# Patient Record
Sex: Female | Born: 1975 | Race: Black or African American | Hispanic: No | Marital: Married | Smoking: Never smoker
Health system: Southern US, Community
[De-identification: ages and names within clinical notes are randomized; demographics above are authoritative.]

## PROBLEM LIST (undated history)

## (undated) ENCOUNTER — Inpatient Hospital Stay (HOSPITAL_COMMUNITY): Payer: Self-pay

## (undated) DIAGNOSIS — D219 Benign neoplasm of connective and other soft tissue, unspecified: Secondary | ICD-10-CM

## (undated) DIAGNOSIS — E669 Obesity, unspecified: Secondary | ICD-10-CM

## (undated) DIAGNOSIS — O021 Missed abortion: Secondary | ICD-10-CM

## (undated) DIAGNOSIS — Z973 Presence of spectacles and contact lenses: Secondary | ICD-10-CM

---

## 1999-07-09 ENCOUNTER — Other Ambulatory Visit: Admission: RE | Admit: 1999-07-09 | Discharge: 1999-07-09 | Payer: Self-pay | Admitting: Gynecology

## 2000-07-02 ENCOUNTER — Other Ambulatory Visit: Admission: RE | Admit: 2000-07-02 | Discharge: 2000-07-02 | Payer: Self-pay | Admitting: Obstetrics and Gynecology

## 2000-07-19 ENCOUNTER — Emergency Department (HOSPITAL_COMMUNITY): Admission: EM | Admit: 2000-07-19 | Discharge: 2000-07-19 | Payer: Self-pay | Admitting: Emergency Medicine

## 2002-01-11 ENCOUNTER — Encounter: Payer: Self-pay | Admitting: Emergency Medicine

## 2002-01-11 ENCOUNTER — Emergency Department (HOSPITAL_COMMUNITY): Admission: EM | Admit: 2002-01-11 | Discharge: 2002-01-11 | Payer: Self-pay | Admitting: Emergency Medicine

## 2002-01-20 ENCOUNTER — Emergency Department (HOSPITAL_COMMUNITY): Admission: EM | Admit: 2002-01-20 | Discharge: 2002-01-20 | Payer: Self-pay | Admitting: Emergency Medicine

## 2002-02-01 ENCOUNTER — Ambulatory Visit (HOSPITAL_COMMUNITY): Admission: RE | Admit: 2002-02-01 | Discharge: 2002-02-01 | Payer: Self-pay | Admitting: Internal Medicine

## 2002-04-14 ENCOUNTER — Other Ambulatory Visit: Admission: RE | Admit: 2002-04-14 | Discharge: 2002-04-14 | Payer: Self-pay | Admitting: Gynecology

## 2003-09-02 ENCOUNTER — Emergency Department (HOSPITAL_COMMUNITY): Admission: EM | Admit: 2003-09-02 | Discharge: 2003-09-02 | Payer: Self-pay | Admitting: Emergency Medicine

## 2003-09-04 ENCOUNTER — Emergency Department (HOSPITAL_COMMUNITY): Admission: EM | Admit: 2003-09-04 | Discharge: 2003-09-05 | Payer: Self-pay | Admitting: Emergency Medicine

## 2006-02-05 ENCOUNTER — Emergency Department (HOSPITAL_COMMUNITY): Admission: EM | Admit: 2006-02-05 | Discharge: 2006-02-05 | Payer: Self-pay | Admitting: Emergency Medicine

## 2006-02-06 ENCOUNTER — Emergency Department (HOSPITAL_COMMUNITY): Admission: EM | Admit: 2006-02-06 | Discharge: 2006-02-06 | Payer: Self-pay | Admitting: Emergency Medicine

## 2006-07-15 ENCOUNTER — Other Ambulatory Visit: Admission: RE | Admit: 2006-07-15 | Discharge: 2006-07-15 | Payer: Self-pay | Admitting: Gynecology

## 2010-05-21 ENCOUNTER — Emergency Department (HOSPITAL_COMMUNITY): Admission: EM | Admit: 2010-05-21 | Discharge: 2010-05-21 | Payer: Self-pay | Admitting: Emergency Medicine

## 2011-04-28 ENCOUNTER — Emergency Department (HOSPITAL_COMMUNITY)
Admission: EM | Admit: 2011-04-28 | Discharge: 2011-04-29 | Disposition: A | Discharge status: Home / Self Care | Payer: 59 | Attending: Emergency Medicine | Admitting: Emergency Medicine

## 2011-04-28 DIAGNOSIS — R21 Rash and other nonspecific skin eruption: Secondary | ICD-10-CM | POA: Insufficient documentation

## 2014-02-05 ENCOUNTER — Encounter (HOSPITAL_COMMUNITY): Payer: Self-pay | Admitting: Emergency Medicine

## 2014-02-05 ENCOUNTER — Emergency Department (HOSPITAL_COMMUNITY)
Admission: EM | Admit: 2014-02-05 | Discharge: 2014-02-05 | Disposition: A | Discharge status: Home / Self Care | Payer: BC Managed Care – PPO | Attending: Emergency Medicine | Admitting: Emergency Medicine

## 2014-02-05 DIAGNOSIS — Z79899 Other long term (current) drug therapy: Secondary | ICD-10-CM | POA: Insufficient documentation

## 2014-02-05 DIAGNOSIS — B86 Scabies: Secondary | ICD-10-CM | POA: Insufficient documentation

## 2014-02-05 MED ORDER — PREDNISONE 20 MG PO TABS: ORAL_TABLET | ORAL | Status: DC

## 2014-02-05 MED ORDER — PERMETHRIN 5 % EX CREA: TOPICAL_CREAM | CUTANEOUS | Status: DC

## 2014-02-05 MED ORDER — HYDROXYZINE HCL 25 MG PO TABS: 25.0000 mg | ORAL_TABLET | Freq: Four times a day (QID) | ORAL | Status: DC

## 2014-02-05 NOTE — Discharge Instructions (Signed)
Scabies  Scabies are small bugs (mites) that burrow under the skin and cause red bumps and severe itching. These bugs can only be seen with a microscope. Scabies are highly contagious. They can spread easily from person to person by direct contact. They are also spread through sharing clothing or linens that have the scabies mites living in them. It is not unusual for an entire family to become infected through shared towels, clothing, or bedding.   HOME CARE INSTRUCTIONS   · Your caregiver may prescribe a cream or lotion to kill the mites. If cream is prescribed, massage the cream into the entire body from the neck to the bottom of both feet. Also massage the cream into the scalp and face if your child is less than 1 year old. Avoid the eyes and mouth. Do not wash your hands after application.  · Leave the cream on for 8 to 12 hours. Your child should bathe or shower after the 8 to 12 hour application period. Sometimes it is helpful to apply the cream to your child right before bedtime.  · One treatment is usually effective and will eliminate approximately 95% of infestations. For severe cases, your caregiver may decide to repeat the treatment in 1 week. Everyone in your household should be treated with one application of the cream.  · New rashes or burrows should not appear within 24 to 48 hours after successful treatment. However, the itching and rash may last for 2 to 4 weeks after successful treatment. Your caregiver may prescribe a medicine to help with the itching or to help the rash go away more quickly.  · Scabies can live on clothing or linens for up to 3 days. All of your child's recently used clothing, towels, stuffed toys, and bed linens should be washed in hot water and then dried in a dryer for at least 20 minutes on high heat. Items that cannot be washed should be enclosed in a plastic bag for at least 3 days.  · To help relieve itching, bathe your child in a cool bath or apply cool washcloths to the  affected areas.  · Your child may return to school after treatment with the prescribed cream.  SEEK MEDICAL CARE IF:   · The itching persists longer than 4 weeks after treatment.  · The rash spreads or becomes infected. Signs of infection include red blisters or yellow-tan crust.  Document Released: 09/15/2005 Document Revised: 12/08/2011 Document Reviewed: 01/24/2009  Exit[employer]® Patient Information ©2014 [employer].

## 2014-02-05 NOTE — ED Provider Notes (Signed)
CSN: [id number]     Arrival date & time 02/05/14  0913 History   First MD Initiated Contact with Patient 02/05/14 [phone]     Chief Complaint  Patient presents with  . Rash     (Consider location/radiation/quality/duration/timing/severity/associated sxs/prior Treatment) HPI Comments: Patient presents with a rash. It's been going on for about 4-5 days. She says is intensely itchy. She's been using hydrocortisone cream without relief. The rash is mostly around her genital area and she also has some bumps around her abdomen in both forearms. She denies any fevers vomiting or other recent illnesses. She's had 2 other family members with similar symptoms and was told he had bed bugs.  Patient is a 38 y.o. female presenting with rash.  Rash Associated symptoms: no abdominal pain, no diarrhea, no fatigue, no fever, no headaches, no joint pain, no nausea, no shortness of breath and not vomiting     History reviewed. No pertinent past medical history. History reviewed. No pertinent past surgical history. History reviewed. No pertinent family history. History  Substance Use Topics  . Smoking status: Never Smoker   . Smokeless tobacco: Not on file  . Alcohol Use: Yes     Comment: social   OB History   Grav Para Term Preterm Abortions TAB SAB Ect Mult Living                 Review of Systems  Constitutional: Negative for fever, chills, diaphoresis and fatigue.  HENT: Negative for congestion, rhinorrhea and sneezing.   Eyes: Negative.   Respiratory: Negative for cough, chest tightness and shortness of breath.   Cardiovascular: Negative for chest pain and leg swelling.  Gastrointestinal: Negative for nausea, vomiting, abdominal pain, diarrhea and blood in stool.  Genitourinary: Negative for frequency, hematuria, flank pain and difficulty urinating.  Musculoskeletal: Negative for arthralgias and back pain.  Skin: Positive for rash.  Neurological: Negative for dizziness, speech difficulty,  weakness, numbness and headaches.      Allergies  Review of patient's allergies indicates no known allergies.  Home Medications   Prior to Admission medications   Medication Sig Start Date End Date Taking? Authorizing Provider  Chaste Tree (VITEX EXTRACT PO) Take 1 capsule by mouth daily.   Yes Historical Provider, MD  hydrOXYzine (ATARAX/VISTARIL) 25 MG tablet Take 1 tablet (25 mg total) by mouth every 6 (six) hours. 02/05/14   Malvin Johns, MD  permethrin (ELIMITE) 5 % cream Apply to affected area once 02/05/14   Malvin Johns, MD  predniSONE (DELTASONE) 20 MG tablet 3 tabs po day one, then 2 po daily x 4 days 02/05/14   Malvin Johns, MD   BP 150/92  Pulse 90  Temp(Src) 98.4 F (36.9 C) (Oral)  Resp 16  SpO2 100%  LMP 01/29/2014 Physical Exam  Constitutional: She is oriented to person, place, and time. She appears well-developed and well-nourished.  HENT:  Head: Normocephalic and atraumatic.  Eyes: Pupils are equal, round, and reactive to light.  Neck: Normal range of motion. Neck supple.  Cardiovascular: Normal rate, regular rhythm and normal heart sounds.   Pulmonary/Chest: Effort normal and breath sounds normal. No respiratory distress. She has no wheezes. She has no rales. She exhibits no tenderness.  Abdominal: Soft. Bowel sounds are normal. There is no tenderness. There is no rebound and no guarding.  Musculoskeletal: Normal range of motion. She exhibits no edema.  Lymphadenopathy:    She has no cervical adenopathy.  Neurological: She is alert and oriented to person, place,  and time.  Skin: Skin is warm and dry. Rash noted.  Are unremarkable small raised papules around the genital area. There's no signs of surrounding infection. There's also some papules on the forearms and across the abdomen. There is no vesicles. No petechiae or purpura.  Psychiatric: She has a normal mood and affect.    ED Course  Procedures (including critical care time) Labs Review Labs  Reviewed - No data to display  Imaging Review No results found.   EKG Interpretation None      MDM   Final diagnoses:  Scabies    Patient appears to have a mite infestation. We'll go ahead and treat her with Elimite cream. I encouraged her to wash all her linens and clothes in hot water with a hot dryer. She was also given a prescription for prednisone and Atarax to help with the symptoms.    Malvin Johns, MD 02/05/14 1014

## 2014-02-05 NOTE — ED Notes (Signed)
Pt states rash to buttocks, stomach and rt arm.  Pt states comes and goes.  Yet, also states that bed partner has same bumps.

## 2014-10-06 ENCOUNTER — Encounter (HOSPITAL_COMMUNITY): Payer: Self-pay | Admitting: *Deleted

## 2014-10-06 ENCOUNTER — Inpatient Hospital Stay (HOSPITAL_COMMUNITY)
Admission: AD | Admit: 2014-10-06 | Discharge: 2014-10-06 | Disposition: A | Discharge status: Home / Self Care | Payer: BLUE CROSS/BLUE SHIELD | Source: Ambulatory Visit | Attending: Obstetrics & Gynecology | Admitting: Obstetrics & Gynecology

## 2014-10-06 DIAGNOSIS — R109 Unspecified abdominal pain: Secondary | ICD-10-CM | POA: Diagnosis present

## 2014-10-06 DIAGNOSIS — Z3201 Encounter for pregnancy test, result positive: Secondary | ICD-10-CM | POA: Diagnosis not present

## 2014-10-06 DIAGNOSIS — N979 Female infertility, unspecified: Secondary | ICD-10-CM | POA: Insufficient documentation

## 2014-10-06 DIAGNOSIS — Z3490 Encounter for supervision of normal pregnancy, unspecified, unspecified trimester: Secondary | ICD-10-CM

## 2014-10-06 HISTORY — DX: Obesity, unspecified: E66.9

## 2014-10-06 HISTORY — DX: Benign neoplasm of connective and other soft tissue, unspecified: D21.9

## 2014-10-06 LAB — URINALYSIS, ROUTINE W REFLEX MICROSCOPIC
Bilirubin Urine: NEGATIVE
Glucose, UA: NEGATIVE mg/dL
Hgb urine dipstick: NEGATIVE
Ketones, ur: 15 mg/dL — AB
Leukocytes, UA: NEGATIVE
Nitrite: NEGATIVE
Protein, ur: NEGATIVE mg/dL
Specific Gravity, Urine: 1.025 (ref 1.005–1.030)
Urobilinogen, UA: 0.2 mg/dL (ref 0.0–1.0)
pH: 6 (ref 5.0–8.0)

## 2014-10-06 LAB — POCT PREGNANCY, URINE: Preg Test, Ur: POSITIVE — AB

## 2014-10-06 LAB — HCG, QUANTITATIVE, PREGNANCY: hCG, Beta Chain, Quant, S: 127 m[IU]/mL — ABNORMAL HIGH (ref <5)

## 2014-10-06 MED ORDER — ULTIMATECARE ONE 27-1 MG PO CAPS: 1.0000 | ORAL_CAPSULE | Freq: Every day | ORAL | Status: DC

## 2014-10-06 NOTE — MAU Note (Signed)
Pt presents to MAU with complaints of lower abdominal pain for approximately 2 weeks. Denies any vaginal bleeding at this time. 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

## 2014-10-06 NOTE — MAU Provider Note (Signed)
  History     CSN: [id number]  Arrival date and time: 10/06/14 1427 Dr Benjie Karvonen placed initial orders Provider here to evaluate patient @ 1615   Chief Complaint  Patient presents with  . Abdominal Pain   HPI (+) home pregnancy test infertility x 12 years Cramping and abdominal aching No sharp abdominal pain No bleeding or spotting  Past Medical History  Diagnosis Date  . Fibroids    History reviewed. No pertinent past surgical history.  History reviewed. No pertinent family history.  History  Substance Use Topics  . Smoking status: Never Smoker   . Smokeless tobacco: Never Used  . Alcohol Use: Yes     Comment: social   Allergies: No Known Allergies  Current medications  ROS  Cramping No bleeding or spotting  Physical Exam   Blood pressure 167/97, temperature 99 F (37.2 C), resp. rate 18, height 5\' 1"  (1.549 m), weight 131.543 kg (290 lb), last menstrual period 08/18/2014.  Physical Exam  Urinalysis : 1025 spec gravity / 15 ketones / other- negative (+) SPT  Alert and oriented / NAD or pain Abdomen soft and non-tender / pendulous panus Spec Exam : scant white discharge / cervix closed without any bleeding Bimanual: uterus enlarged with nodular lesion with 3-4cm in lower uterine segment of uterus                   non-tender with exam                   adnexa without tenderness / limitation due to habitus to identify ovary  MAU Course  Procedures  Quant HCG - 127   Assessment and Plan  (+) pregnancy confirmed - 7 weeks by LMP not correlated by HCG level  Repeat HCG Monday at [facility] Call if any red bleeding or spotting over weekend / any sharp unilateral and persistent pain                (miscarriage and ectopic precautions)  Artelia Laroche 10/06/2014, 5:23 PM

## 2014-11-29 ENCOUNTER — Encounter (HOSPITAL_BASED_OUTPATIENT_CLINIC_OR_DEPARTMENT_OTHER): Payer: Self-pay | Admitting: *Deleted

## 2014-11-29 NOTE — H&P (Signed)
Vicki Thompson is an 39 y.o. female with missed abortion at 55 weeks by early ultrasound.  Patient was counseled re: her options and elected to proceed with D&C.  Medical hx complicated by AMA, fibroid uterus and obesity.  Pertinent Gynecological History: Menses: n/a Bleeding: light spotting Contraception: pregnancy DES exposure: unknown Blood transfusions: none Sexually transmitted diseases: no past history Previous GYN Procedures: none  Last mammogram: n/a Date: n/a Last pap: unkn Date: n/a OB History: G1   Menstrual History: Menarche age: n/a Patient's last menstrual period was 08/18/2014.    Past Medical History  Diagnosis Date  . Fibroids   . Missed ab   . Wears contact lenses     No past surgical history on file.  No family history on file.  Social History:  reports that she has never smoked. She has never used smokeless tobacco. She reports that she does not drink alcohol or use illicit drugs.  Allergies: No Known Allergies  No prescriptions prior to admission    ROS  Height 5\' 1"  (1.549 m), weight 295 lb (133.811 kg), last menstrual period 08/18/2014. Physical Exam  Constitutional: She is oriented to person, place, and time. She appears well-developed and well-nourished.  GI: Soft. There is no rebound and no guarding.  Neurological: She is alert and oriented to person, place, and time.  Skin: Skin is warm and dry.  Psychiatric: She has a normal mood and affect. Her behavior is normal.    No results found for this or any previous visit (from the past 24 hour(s)).  No results found.  Assessment/Plan: 39 yo G1 at 11 weeks with missed abortion -Suction D&C  Jasan Doughtie 11/29/2014, 9:05 PM

## 2014-11-29 NOTE — Progress Notes (Addendum)
NPO AFTER MN WITH EXCEPTION CLEAR LIQUIDS UNTIL 0700.  ARRIVE AT 1130.  PRE-OP ORDERS PENDING.  PT IS  , AB+, PER POSTING.  NEEDS HG.  PER DR C. JACKSON OK FOR PT TO BE DONE HERE W/ BMI OF 56 SINCE PT DOES NOT HAVE ANY OTHER MEDICAL ISSUES.

## 2014-11-30 ENCOUNTER — Encounter (HOSPITAL_BASED_OUTPATIENT_CLINIC_OR_DEPARTMENT_OTHER)
Admission: RE | Disposition: A | Discharge status: Home Health | Payer: Self-pay | Source: Ambulatory Visit | Attending: Obstetrics & Gynecology

## 2014-11-30 ENCOUNTER — Ambulatory Visit (HOSPITAL_BASED_OUTPATIENT_CLINIC_OR_DEPARTMENT_OTHER): Payer: BLUE CROSS/BLUE SHIELD | Admitting: Anesthesiology

## 2014-11-30 ENCOUNTER — Encounter (HOSPITAL_BASED_OUTPATIENT_CLINIC_OR_DEPARTMENT_OTHER): Payer: Self-pay | Admitting: *Deleted

## 2014-11-30 ENCOUNTER — Ambulatory Visit (HOSPITAL_BASED_OUTPATIENT_CLINIC_OR_DEPARTMENT_OTHER)
Admission: RE | Admit: 2014-11-30 | Discharge: 2014-11-30 | Disposition: A | Discharge status: Home Health | Payer: BLUE CROSS/BLUE SHIELD | Source: Ambulatory Visit | Attending: Obstetrics & Gynecology | Admitting: Obstetrics & Gynecology

## 2014-11-30 DIAGNOSIS — Z3A09 9 weeks gestation of pregnancy: Secondary | ICD-10-CM | POA: Insufficient documentation

## 2014-11-30 DIAGNOSIS — D259 Leiomyoma of uterus, unspecified: Secondary | ICD-10-CM | POA: Insufficient documentation

## 2014-11-30 DIAGNOSIS — O021 Missed abortion: Secondary | ICD-10-CM | POA: Insufficient documentation

## 2014-11-30 DIAGNOSIS — Z6841 Body Mass Index (BMI) 40.0 and over, adult: Secondary | ICD-10-CM | POA: Diagnosis not present

## 2014-11-30 HISTORY — DX: Presence of spectacles and contact lenses: Z97.3

## 2014-11-30 HISTORY — PX: DILATION AND EVACUATION: SHX1459

## 2014-11-30 HISTORY — DX: Missed abortion: O02.1

## 2014-11-30 LAB — CBC
HCT: 38.8 % (ref 36.0–46.0)
HEMOGLOBIN: 13.5 g/dL (ref 12.0–15.0)
MCH: 31.1 pg (ref 26.0–34.0)
MCHC: 34.8 g/dL (ref 30.0–36.0)
MCV: 89.4 fL (ref 78.0–100.0)
PLATELETS: 300 10*3/uL (ref 150–400)
RBC: 4.34 MIL/uL (ref 3.87–5.11)
RDW: 13.7 % (ref 11.5–15.5)
WBC: 14 10*3/uL — ABNORMAL HIGH (ref 4.0–10.5)

## 2014-11-30 SURGERY — DILATION AND EVACUATION, UTERUS
Anesthesia: Monitor Anesthesia Care | Site: Vagina

## 2014-11-30 MED ORDER — DOXYCYCLINE HYCLATE 100 MG PO TABS
200.0000 mg | ORAL_TABLET | Freq: Two times a day (BID) | ORAL | Status: DC
Start: 2014-11-30 — End: 2014-11-30
  Filled 2014-11-30: qty 2

## 2014-11-30 MED ORDER — PROMETHAZINE HCL 25 MG/ML IJ SOLN
6.2500 mg | INTRAMUSCULAR | Status: DC | PRN
  Filled 2014-11-30: qty 1

## 2014-11-30 MED ORDER — CHLOROPROCAINE HCL 1 % IJ SOLN
INTRAMUSCULAR | Status: DC | PRN
  Administered 2014-11-30: 10 mL

## 2014-11-30 MED ORDER — FENTANYL CITRATE 0.05 MG/ML IJ SOLN
INTRAMUSCULAR | Status: AC
Start: 2014-11-30 — End: 2014-11-30
  Filled 2014-11-30: qty 4

## 2014-11-30 MED ORDER — DOXYCYCLINE HYCLATE 100 MG IV SOLR
100.0000 mg | Freq: Once | INTRAVENOUS | Status: AC
  Administered 2014-11-30: 100 mg via INTRAVENOUS
  Filled 2014-11-30: qty 100

## 2014-11-30 MED ORDER — LACTATED RINGERS IV SOLN
INTRAVENOUS | Status: DC
  Administered 2014-11-30: 12:00:00 via INTRAVENOUS
  Filled 2014-11-30: qty 1000

## 2014-11-30 MED ORDER — FENTANYL CITRATE 0.05 MG/ML IJ SOLN
INTRAMUSCULAR | Status: DC | PRN
  Administered 2014-11-30: 25 ug via INTRAVENOUS
  Administered 2014-11-30: 50 ug via INTRAVENOUS
  Administered 2014-11-30: 25 ug via INTRAVENOUS

## 2014-11-30 MED ORDER — DEXTROSE IN LACTATED RINGERS 5 % IV SOLN
INTRAVENOUS | Status: DC
  Administered 2014-11-30: 13:00:00 via INTRAVENOUS
  Filled 2014-11-30: qty 1000

## 2014-11-30 MED ORDER — ONDANSETRON HCL 4 MG/2ML IJ SOLN
INTRAMUSCULAR | Status: DC | PRN
  Administered 2014-11-30: 4 mg via INTRAVENOUS

## 2014-11-30 MED ORDER — KETOROLAC TROMETHAMINE 30 MG/ML IJ SOLN
INTRAMUSCULAR | Status: DC | PRN
  Administered 2014-11-30: 30 mg via INTRAVENOUS

## 2014-11-30 MED ORDER — OXYCODONE-ACETAMINOPHEN 7.5-325 MG PO TABS: 1.0000 | ORAL_TABLET | ORAL | Status: DC | PRN

## 2014-11-30 MED ORDER — MIDAZOLAM HCL 5 MG/5ML IJ SOLN
INTRAMUSCULAR | Status: DC | PRN
  Administered 2014-11-30: 2 mg via INTRAVENOUS

## 2014-11-30 MED ORDER — IBUPROFEN 800 MG PO TABS: 800.0000 mg | ORAL_TABLET | Freq: Three times a day (TID) | ORAL | Status: DC | PRN

## 2014-11-30 MED ORDER — MIDAZOLAM HCL 2 MG/2ML IJ SOLN
INTRAMUSCULAR | Status: AC
  Filled 2014-11-30: qty 2

## 2014-11-30 MED ORDER — SODIUM CHLORIDE 0.9 % IR SOLN
Status: DC | PRN
  Administered 2014-11-30: 500 mL

## 2014-11-30 MED ORDER — FENTANYL CITRATE 0.05 MG/ML IJ SOLN
25.0000 ug | INTRAMUSCULAR | Status: DC | PRN
  Filled 2014-11-30: qty 1

## 2014-11-30 MED ORDER — PROPOFOL 10 MG/ML IV EMUL
INTRAVENOUS | Status: DC | PRN
  Administered 2014-11-30: 200 ug/kg/min via INTRAVENOUS

## 2014-11-30 MED ORDER — DEXAMETHASONE SODIUM PHOSPHATE 4 MG/ML IJ SOLN
INTRAMUSCULAR | Status: DC | PRN
  Administered 2014-11-30: 8 mg via INTRAVENOUS

## 2014-11-30 SURGICAL SUPPLY — 23 items
CATH ROBINSON RED A/P 16FR (CATHETERS) ×3 IMPLANT
COVER TABLE BACK 60X90 (DRAPES) ×4 IMPLANT
DRAPE LG THREE QUARTER DISP (DRAPES) ×3 IMPLANT
DRAPE UNDERBUTTOCKS STRL (DRAPE) ×2 IMPLANT
GLOVE BIO SURGEON STRL SZ 6 (GLOVE) ×8 IMPLANT
GLOVE BIOGEL PI IND STRL 6.5 (GLOVE) ×2 IMPLANT
GLOVE BIOGEL PI INDICATOR 6.5 (GLOVE) ×6
GLOVE INDICATOR 6.5 STRL GRN (GLOVE) ×2 IMPLANT
GOWN STRL REUS W/ TWL LRG LVL3 (GOWN DISPOSABLE) IMPLANT
GOWN STRL REUS W/TWL LRG LVL3 (GOWN DISPOSABLE) ×6
KIT BERKELEY 1ST TRIMESTER 3/8 (MISCELLANEOUS) ×2 IMPLANT
LEGGING LITHOTOMY PAIR STRL (DRAPES) ×3 IMPLANT
NDL SPNL 22GX3.5 QUINCKE BK (NEEDLE) IMPLANT
NEEDLE SPNL 22GX3.5 QUINCKE BK (NEEDLE) ×3 IMPLANT
NS IRRIG 500ML POUR BTL (IV SOLUTION) ×2 IMPLANT
PACK BASIN DAY SURGERY FS (CUSTOM PROCEDURE TRAY) ×2 IMPLANT
PAD OB MATERNITY 4.3X12.25 (PERSONAL CARE ITEMS) ×2 IMPLANT
PAD PREP 24X48 CUFFED NSTRL (MISCELLANEOUS) ×2 IMPLANT
SET BERKELEY SUCTION TUBING (SUCTIONS) ×2 IMPLANT
SYR CONTROL 10ML LL (SYRINGE) ×2 IMPLANT
TOWEL OR 17X24 6PK STRL BLUE (TOWEL DISPOSABLE) ×6 IMPLANT
TRAY DSU PREP LF (CUSTOM PROCEDURE TRAY) ×3 IMPLANT
VACURETTE 9 RIGID CVD (CANNULA) ×2 IMPLANT

## 2014-11-30 NOTE — Anesthesia Preprocedure Evaluation (Addendum)
Anesthesia Evaluation  Patient identified by MRN, date of birth, ID band Patient awake    Reviewed: Allergy & Precautions, NPO status , Patient's Chart, lab work & pertinent test results  Airway Mallampati: II  TM Distance: >3 FB Neck ROM: Full    Dental no notable dental hx.    Pulmonary neg pulmonary ROS,  breath sounds clear to auscultation  Pulmonary exam normal       Cardiovascular negative cardio ROS  Rhythm:Regular Rate:Normal     Neuro/Psych negative neurological ROS  negative psych ROS   GI/Hepatic negative GI ROS, Neg liver ROS,   Endo/Other  Morbid obesity  Renal/GU negative Renal ROS  negative genitourinary   Musculoskeletal negative musculoskeletal ROS (+)   Abdominal (+) + obese,   Peds negative pediatric ROS (+)  Hematology negative hematology ROS (+)   Anesthesia Other Findings   Reproductive/Obstetrics negative OB ROS                             Anesthesia Physical Anesthesia Plan  ASA: III  Anesthesia Plan: MAC   Post-op Pain Management:    Induction: Intravenous  Airway Management Planned:   Additional Equipment:   Intra-op Plan:   Post-operative Plan:   Informed Consent: I have reviewed the patients History and Physical, chart, labs and discussed the procedure including the risks, benefits and alternatives for the proposed anesthesia with the patient or authorized representative who has indicated his/her understanding and acceptance.   Dental advisory given  Plan Discussed with: CRNA  Anesthesia Plan Comments: (Plan MAC with GA backup)       Anesthesia Quick Evaluation

## 2014-11-30 NOTE — Op Note (Signed)
Patient: Vicki Thompson,Vicki Thompson DOB: October 06, 1975 PREOPERATIVE DIAGNOSIS: Missed abortion at 9 weeks  POSTOPERATIVE DIAGNOSIS: The same  PROCEDURE: Suction Dilation and Curettage   SURGEON: Dr. Linda Hedges  INDICATIONS: 39 y.o. G1 here for scheduled surgery for suction D&C for missed abortion at 50 weeks. Risks of surgery were discussed with the patient including but not limited to: bleeding which may require transfusion; infection which may require antibiotics; injury to uterus or surrounding organs; intrauterine scarring which may impair future fertility; need for additional procedures including laparotomy or laparoscopy; and other postoperative/anesthesia complications. Written informed consent was obtained.  FINDINGS: An enlarged, irregularly shaped fibroid uterus.  ANESTHESIA: conscious sedation and local  ESTIMATED BLOOD LOSS: 150cc  SPECIMENS: Products of conception sent for pathology COMPLICATIONS: None immediate.  PROCEDURE DETAILS: The patient received intravenous antibiotics while in the preoperative area. She was then taken to the operating room where conscious sedation was administered. After an adequate timeout was performed, she was placed in the dorsal lithotomy position and examined; then prepped and draped in the sterile manner. Her bladder was catheterized for an unmeasured amount of clear, yellow urine. A speculum was then placed in the patient's vagina and cervical block was performed using 10cc of 1% nesacaine. a single tooth tenaculum was applied to the anterior lip of the cervix. The uteus was dilated manually with Pratt cervical dilators up to 27 [place]. Once the cervix was dilated, 9 mm suction curette was advanced and suction attached. Three suction curettage passes were performed which did remove tissue. A sharp curettage was then performed and revealed a gritty texture circumferentially, however, an irregularly shaped cavity was noted. The tenaculum was removed from the  anterior lip of the cervix and the vaginal speculum was removed after noting good hemostasis. The patient tolerated the procedure well and was taken to the recovery area awake, extubated and in stable condition. A single postoperative dose of doxycycline was ordered.

## 2014-11-30 NOTE — Progress Notes (Signed)
No change to H&P.  Patient counseled for D&C including risk of bleeding, infection, scarring and damage to surrounding structures.  She understands the risk of retained POC especially in light of her large fibroids.  All questions were answered and the patient wishes to proceed.    Linda Hedges, DO

## 2014-11-30 NOTE — Discharge Instructions (Signed)
Call MD for T>100.4, heavy vaginal bleeding, severe abdominal pain, intractable nausea and/or vomiting, or respiratory distress.  Call office to schedule postop appointment in 2 weeks.  No driving while taking narcotics.  Pelvic rest x 4 weeks. Post Anesthesia Home Care Instructions  Activity: Get plenty of rest for the remainder of the day. A responsible adult should stay with you for 24 hours following the procedure.  For the next 24 hours, DO NOT: -Drive a car -Paediatric nurse -Drink alcoholic beverages -Take any medication unless instructed by your physician -Make any legal decisions or sign important papers.  Meals: Start with liquid foods such as gelatin or soup. Progress to regular foods as tolerated. Avoid greasy, spicy, heavy foods. If nausea and/or vomiting occur, drink only clear liquids until the nausea and/or vomiting subsides. Call your physician if vomiting continues.  Special Instructions/Symptoms: Your throat may feel dry or sore from the anesthesia or the breathing tube placed in your throat during surgery. If this causes discomfort, gargle with warm salt water. The discomfort should disappear within 24 hours.

## 2014-11-30 NOTE — Anesthesia Postprocedure Evaluation (Signed)
  Anesthesia Post-op Note  Patient: Vicki Thompson  Procedure(s) Performed: Procedure(s): DILATATION AND EVACUATION (N/A)  Patient Location: PACU  Anesthesia Type:MAC  Level of Consciousness: awake and alert   Airway and Oxygen Therapy: Patient Spontanous Breathing  Post-op Pain: none  Post-op Assessment: Post-op Vital signs reviewed, Patient's Cardiovascular Status Stable, Respiratory Function Stable, Patent Airway, No signs of Nausea or vomiting and Pain level controlled  Post-op Vital Signs: Reviewed and stable  Last Vitals:  Filed Vitals:   11/30/14 1400  BP: 113/60  Pulse: 84  Temp:   Resp: 20    Complications: No apparent anesthesia complications

## 2014-11-30 NOTE — Transfer of Care (Signed)
Immediate Anesthesia Transfer of Care Note  Patient: Vicki Thompson. Glo Herring  Procedure(s) Performed: Procedure(s): DILATATION AND EVACUATION (N/A)  Patient Location: PACU  Anesthesia Type:MAC  Level of Consciousness: awake  Airway & Oxygen Therapy: Patient Spontanous Breathing  Post-op Assessment: Report given to RN, Post -op Vital signs reviewed and stable and Patient moving all extremities  Post vital signs: Reviewed and stable  Last Vitals:  Filed Vitals:   11/30/14 1411  BP:   Pulse: 90  Temp:   Resp: 17    Complications: No apparent anesthesia complications

## 2014-12-01 ENCOUNTER — Encounter (HOSPITAL_BASED_OUTPATIENT_CLINIC_OR_DEPARTMENT_OTHER): Payer: Self-pay | Admitting: Obstetrics & Gynecology

## 2015-08-11 ENCOUNTER — Encounter (HOSPITAL_COMMUNITY): Payer: Self-pay | Admitting: *Deleted

## 2017-07-01 ENCOUNTER — Emergency Department (HOSPITAL_COMMUNITY): Payer: No Typology Code available for payment source

## 2017-07-01 ENCOUNTER — Encounter (HOSPITAL_COMMUNITY): Payer: Self-pay | Admitting: Family Medicine

## 2017-07-01 ENCOUNTER — Emergency Department (HOSPITAL_COMMUNITY)
Admission: EM | Admit: 2017-07-01 | Discharge: 2017-07-01 | Disposition: A | Discharge status: Home / Self Care | Payer: No Typology Code available for payment source | Attending: Emergency Medicine | Admitting: Emergency Medicine

## 2017-07-01 DIAGNOSIS — Y92410 Unspecified street and highway as the place of occurrence of the external cause: Secondary | ICD-10-CM | POA: Diagnosis not present

## 2017-07-01 DIAGNOSIS — Y998 Other external cause status: Secondary | ICD-10-CM | POA: Diagnosis not present

## 2017-07-01 DIAGNOSIS — Y9389 Activity, other specified: Secondary | ICD-10-CM | POA: Insufficient documentation

## 2017-07-01 DIAGNOSIS — S161XXA Strain of muscle, fascia and tendon at neck level, initial encounter: Secondary | ICD-10-CM | POA: Diagnosis not present

## 2017-07-01 DIAGNOSIS — S39012A Strain of muscle, fascia and tendon of lower back, initial encounter: Secondary | ICD-10-CM

## 2017-07-01 DIAGNOSIS — S3992XA Unspecified injury of lower back, initial encounter: Secondary | ICD-10-CM | POA: Diagnosis present

## 2017-07-01 LAB — POC URINE PREG, ED: Preg Test, Ur: NEGATIVE

## 2017-07-01 MED ORDER — CYCLOBENZAPRINE HCL 10 MG PO TABS: 10.0000 mg | ORAL_TABLET | Freq: Every day | ORAL | 0 refills | Status: DC

## 2017-07-01 MED ORDER — HYDROCODONE-ACETAMINOPHEN 5-325 MG PO TABS
1.0000 | ORAL_TABLET | Freq: Once | ORAL | Status: AC
Start: 2017-07-01 — End: 2017-07-01
  Administered 2017-07-01: 1 via ORAL
  Filled 2017-07-01: qty 1

## 2017-07-01 MED ORDER — IBUPROFEN 800 MG PO TABS: 800.0000 mg | ORAL_TABLET | Freq: Three times a day (TID) | ORAL | 0 refills | Status: DC | PRN

## 2017-07-01 MED ORDER — TRAMADOL HCL 50 MG PO TABS: 50.0000 mg | ORAL_TABLET | Freq: Four times a day (QID) | ORAL | 0 refills | Status: DC | PRN

## 2017-07-01 NOTE — ED Notes (Signed)
Patient is in radiology.  

## 2017-07-01 NOTE — ED Notes (Signed)
Bed: [facility] Expected date:  Expected time:  Means of arrival:  Comments: No bed.  

## 2017-07-01 NOTE — Discharge Instructions (Signed)
Return here as needed.  Follow-up with your primary doctor.  Use ice and heat on the areas that are sore.
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
## 2019-08-12 ENCOUNTER — Emergency Department (HOSPITAL_BASED_OUTPATIENT_CLINIC_OR_DEPARTMENT_OTHER): Payer: BC Managed Care – PPO

## 2019-08-12 ENCOUNTER — Other Ambulatory Visit: Payer: Self-pay

## 2019-08-12 ENCOUNTER — Emergency Department (HOSPITAL_BASED_OUTPATIENT_CLINIC_OR_DEPARTMENT_OTHER)
Admission: EM | Admit: 2019-08-12 | Discharge: 2019-08-12 | Disposition: A | Discharge status: Home / Self Care | Payer: BC Managed Care – PPO | Attending: Emergency Medicine | Admitting: Emergency Medicine

## 2019-08-12 ENCOUNTER — Encounter (HOSPITAL_BASED_OUTPATIENT_CLINIC_OR_DEPARTMENT_OTHER): Payer: Self-pay | Admitting: Emergency Medicine

## 2019-08-12 DIAGNOSIS — U071 COVID-19: Secondary | ICD-10-CM | POA: Insufficient documentation

## 2019-08-12 DIAGNOSIS — R509 Fever, unspecified: Secondary | ICD-10-CM | POA: Diagnosis present

## 2019-08-12 DIAGNOSIS — Z20822 Contact with and (suspected) exposure to covid-19: Secondary | ICD-10-CM

## 2019-08-12 LAB — SARS CORONAVIRUS 2 (TAT 6-24 HRS): SARS Coronavirus 2: POSITIVE — AB

## 2019-08-12 MED ORDER — IBUPROFEN 800 MG PO TABS
800.0000 mg | ORAL_TABLET | Freq: Once | ORAL | Status: AC
  Administered 2019-08-12: 800 mg via ORAL
  Filled 2019-08-12: qty 1

## 2019-08-12 MED ORDER — ACETAMINOPHEN 325 MG PO TABS
650.0000 mg | ORAL_TABLET | Freq: Once | ORAL | Status: AC
  Administered 2019-08-12: 650 mg via ORAL
  Filled 2019-08-12: qty 2

## 2019-08-12 NOTE — ED Triage Notes (Signed)
Cough, body aches, fever, chest congestion x5 days.  Exposed to Covid at work. Presents to Ed "to be tested".

## 2019-08-12 NOTE — ED Provider Notes (Signed)
[facility] EMERGENCY DEPARTMENT Provider Note   CSN: [id number] Arrival date & time: 08/12/19  [id number]     History   Chief Complaint Chief Complaint  Patient presents with  . Covid sx with exposure    HPI Vicki Thompson is a 43 y.o. female.     Pt presents to the ED today with fever, cough, congestion.  Pt said she has had some work exposures with covid.  Pt said her sx started on Sunday, 11/8, but they were mild.  She went to work on the 9th, but got worse that night, so she has not been back to work since then.  She has not had a fever this morning, but still has body aches and cough.     Past Medical History:  Diagnosis Date  . Fibroids   . Missed ab   . Obesity   . Wears contact lenses     There are no active problems to display for this patient.   Past Surgical History:  Procedure Laterality Date  . DILATION AND EVACUATION N/A 11/30/2014   Procedure: DILATATION AND EVACUATION;  Surgeon: Linda Hedges, DO;  Location: [facility];  Service: Gynecology;  Laterality: N/A;     OB History    Gravida  1   Para      Term      Preterm      AB      Living        SAB      TAB      Ectopic      Multiple      Live Births               Home Medications    Prior to Admission medications   Not on File    Family History No family history on file.  Social History Social History   Tobacco Use  . Smoking status: Never Smoker  . Smokeless tobacco: Never Used  Substance Use Topics  . Alcohol use: No  . Drug use: No     Allergies   Patient has no known allergies.   Review of Systems Review of Systems  Constitutional: Positive for fatigue and fever.  Respiratory: Positive for cough.   All other systems reviewed and are negative.    Physical Exam Updated Vital Signs BP (!) 150/92 (BP Location: Right Arm)   Pulse 99   Temp 98.9 F (37.2 C) (Oral)   Resp 16   Ht 5\' 1"  (1.549 m)   Wt (!) 139.7 kg   LMP  07/25/2019   SpO2 100%   BMI 58.20 kg/m   Physical Exam Vitals signs and nursing note reviewed.  Constitutional:      Appearance: Normal appearance.  HENT:     Head: Normocephalic and atraumatic.     Right Ear: External ear normal.     Left Ear: External ear normal.     Nose: Nose normal.     Mouth/Throat:     Mouth: Mucous membranes are moist.     Pharynx: Oropharynx is clear.  Eyes:     Extraocular Movements: Extraocular movements intact.     Conjunctiva/sclera: Conjunctivae normal.     Pupils: Pupils are equal, round, and reactive to light.  Neck:     Musculoskeletal: Normal range of motion and neck supple.  Cardiovascular:     Rate and Rhythm: Normal rate and regular rhythm.     Pulses: Normal pulses.  Heart sounds: Normal heart sounds.  Pulmonary:     Effort: Pulmonary effort is normal.     Breath sounds: Normal breath sounds.  Abdominal:     General: Abdomen is flat. Bowel sounds are normal.     Palpations: Abdomen is soft.  Musculoskeletal: Normal range of motion.  Skin:    General: Skin is warm.     Capillary Refill: Capillary refill takes less than 2 seconds.  Neurological:     General: No focal deficit present.     Mental Status: She is alert and oriented to person, place, and time.  Psychiatric:        Mood and Affect: Mood normal.        Behavior: Behavior normal.        Thought Content: Thought content normal.        Judgment: Judgment normal.      ED Treatments / Results  Labs (all labs ordered are listed, but only abnormal results are displayed) Labs Reviewed  SARS CORONAVIRUS 2 (TAT 6-24 HRS)    EKG None  Radiology Dg Chest Portable 1 View  Result Date: 08/12/2019 CLINICAL DATA:  43 year old female with history of cough, body aches, fever and chest congestion for the past 5 days. EXAM: PORTABLE CHEST 1 VIEW COMPARISON:  No priors. FINDINGS: Lung volumes are normal. No consolidative airspace disease. No pleural effusions. No  pneumothorax. No pulmonary nodule or mass noted. Pulmonary vasculature and the cardiomediastinal silhouette are within normal limits. IMPRESSION: No radiographic evidence of acute cardiopulmonary disease. Electronically Signed   By: Vinnie Langton M.D.   On: 08/12/2019 08:27    Procedures Procedures (including critical care time)  Medications Ordered in ED Medications  ibuprofen (ADVIL) tablet 800 mg (800 mg Oral Given 08/12/19 0829)  acetaminophen (TYLENOL) tablet 650 mg (650 mg Oral Given 08/12/19 [id number])     Initial Impression / Assessment and Plan / ED Course  I have reviewed the triage vital signs and the nursing notes.  Pertinent labs & imaging results that were available during my care of the patient were reviewed by me and considered in my medical decision making (see chart for details).     I suspect pt has Covid as she has sx and she's been exposed.  She was swabbed in the ED.  She is saturating 100% and looks nontoxic.  CXR ok.  She is stable for d/c.  She knows to return if worse.  She is given covid precautions.  Final Clinical Impressions(s) / ED Diagnoses   Final diagnoses:  Suspected COVID-19 virus infection    ED Discharge Orders    None       Isla Pence, MD 08/12/19 [phone]

## 2019-08-12 NOTE — ED Notes (Signed)
ED Provider at bedside. 

## 2019-08-22 ENCOUNTER — Telehealth (HOSPITAL_COMMUNITY): Payer: Self-pay

## 2020-03-22 IMAGING — DX DG CHEST 1V PORT
1 series · 1 of 1 positions shown · non-contrast
Comparison: No priors.

CLINICAL DATA: 43-year-old female with history of cough, body
aches, fever and chest congestion for the past 5 days.

EXAM:
PORTABLE CHEST 1 VIEW

[chest ap]
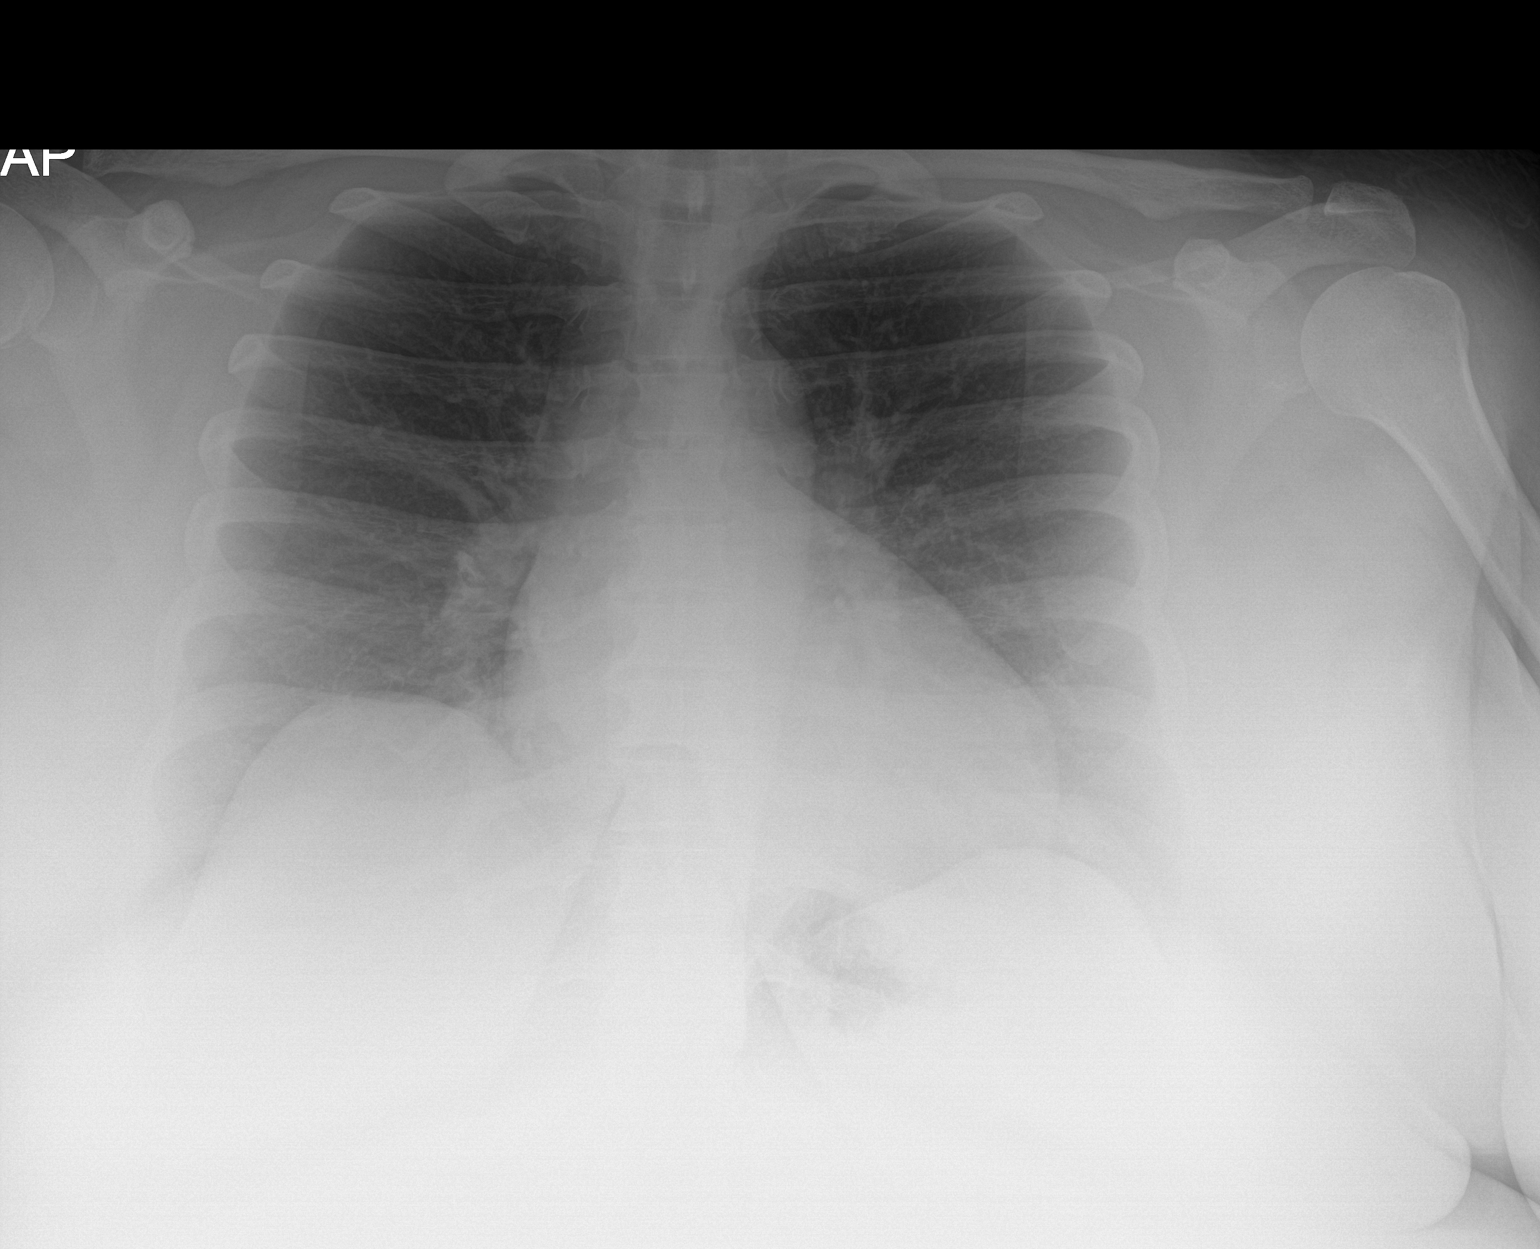

[1 of 1 positions shown; findings below may reference images not displayed]

FINDINGS: Lung volumes are normal. No consolidative airspace disease. No
pleural effusions. No pneumothorax. No pulmonary nodule or mass
noted. Pulmonary vasculature and the cardiomediastinal silhouette
are within normal limits.
IMPRESSION: No radiographic evidence of acute cardiopulmonary disease.
